# Patient Record
Sex: Male | Born: 1995 | Marital: Single | Smoking: Never smoker
Health system: Southern US, Community
[De-identification: ages and names within clinical notes are randomized; demographics above are authoritative.]

## PROBLEM LIST (undated history)

## (undated) DIAGNOSIS — F1290 Cannabis use, unspecified, uncomplicated: Secondary | ICD-10-CM

## (undated) HISTORY — DX: Cannabis use, unspecified, uncomplicated: F12.90

---
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
## 2017-09-15 ENCOUNTER — Other Ambulatory Visit: Payer: Self-pay

## 2017-09-15 ENCOUNTER — Emergency Department (HOSPITAL_COMMUNITY): Payer: 59

## 2017-09-15 ENCOUNTER — Encounter (HOSPITAL_COMMUNITY): Payer: Self-pay

## 2017-09-15 ENCOUNTER — Observation Stay (HOSPITAL_COMMUNITY)
Admission: EM | Admit: 2017-09-15 | Discharge: 2017-09-16 | Disposition: A | Discharge status: Home / Self Care | Payer: 59 | Attending: Emergency Medicine | Admitting: Emergency Medicine

## 2017-09-15 DIAGNOSIS — T2220XA Burn of second degree of shoulder and upper limb, except wrist and hand, unspecified site, initial encounter: Secondary | ICD-10-CM | POA: Diagnosis not present

## 2017-09-15 DIAGNOSIS — S41112A Laceration without foreign body of left upper arm, initial encounter: Secondary | ICD-10-CM

## 2017-09-15 DIAGNOSIS — S51012A Laceration without foreign body of left elbow, initial encounter: Secondary | ICD-10-CM | POA: Diagnosis not present

## 2017-09-15 DIAGNOSIS — T3120 Burns involving 20-29% of body surface with 0% to 9% third degree burns: Secondary | ICD-10-CM | POA: Diagnosis not present

## 2017-09-15 DIAGNOSIS — S0012XA Contusion of left eyelid and periocular area, initial encounter: Principal | ICD-10-CM | POA: Insufficient documentation

## 2017-09-15 DIAGNOSIS — T2020XA Burn of second degree of head, face, and neck, unspecified site, initial encounter: Secondary | ICD-10-CM | POA: Insufficient documentation

## 2017-09-15 DIAGNOSIS — R410 Disorientation, unspecified: Secondary | ICD-10-CM | POA: Diagnosis present

## 2017-09-15 DIAGNOSIS — F1290 Cannabis use, unspecified, uncomplicated: Secondary | ICD-10-CM | POA: Insufficient documentation

## 2017-09-15 DIAGNOSIS — T07XXXA Unspecified multiple injuries, initial encounter: Secondary | ICD-10-CM

## 2017-09-15 DIAGNOSIS — F1910 Other psychoactive substance abuse, uncomplicated: Secondary | ICD-10-CM | POA: Diagnosis not present

## 2017-09-15 DIAGNOSIS — T2121XA Burn of second degree of chest wall, initial encounter: Secondary | ICD-10-CM | POA: Insufficient documentation

## 2017-09-15 DIAGNOSIS — T300 Burn of unspecified body region, unspecified degree: Secondary | ICD-10-CM

## 2017-09-15 LAB — ETHANOL: Alcohol, Ethyl (B): <10 mg/dL (ref <10)

## 2017-09-15 LAB — CBC
HEMATOCRIT: 42.2 % (ref 39.0–52.0)
Hemoglobin: 14.5 g/dL (ref 13.0–17.0)
MCH: 33 pg (ref 26.0–34.0)
MCHC: 34.4 g/dL (ref 30.0–36.0)
MCV: 96.1 fL (ref 78.0–100.0)
Platelets: 352 10*3/uL (ref 150–400)
RBC: 4.39 MIL/uL (ref 4.22–5.81)
RDW: 13.2 % (ref 11.5–15.5)
WBC: 19.5 10*3/uL — ABNORMAL HIGH (ref 4.0–10.5)

## 2017-09-15 LAB — COMPREHENSIVE METABOLIC PANEL
ALBUMIN: 4.2 g/dL (ref 3.5–5.0)
ALK PHOS: 68 U/L (ref 38–126)
ALT: 96 U/L — ABNORMAL HIGH (ref 17–63)
AST: 145 U/L — AB (ref 15–41)
Anion gap: 10 (ref 5–15)
BUN: 11 mg/dL (ref 6–20)
CALCIUM: 8.8 mg/dL — AB (ref 8.9–10.3)
CO2: 21 mmol/L — ABNORMAL LOW (ref 22–32)
Chloride: 110 mmol/L (ref 101–111)
Creatinine, Ser: 1.01 mg/dL (ref 0.61–1.24)
GFR calc Af Amer: >60 mL/min (ref >60)
GFR calc non Af Amer: >60 mL/min (ref >60)
GLUCOSE: 168 mg/dL — AB (ref 65–99)
POTASSIUM: 3.1 mmol/L — AB (ref 3.5–5.1)
Sodium: 141 mmol/L (ref 135–145)
TOTAL PROTEIN: 6.9 g/dL (ref 6.5–8.1)
Total Bilirubin: 0.5 mg/dL (ref 0.3–1.2)

## 2017-09-15 LAB — I-STAT CHEM 8, ED
BUN: 12 mg/dL (ref 6–20)
CHLORIDE: 107 mmol/L (ref 101–111)
Calcium, Ion: 1.16 mmol/L (ref 1.15–1.40)
Creatinine, Ser: 0.9 mg/dL (ref 0.61–1.24)
Glucose, Bld: 177 mg/dL — ABNORMAL HIGH (ref 65–99)
HEMATOCRIT: 43 % (ref 39.0–52.0)
Hemoglobin: 14.6 g/dL (ref 13.0–17.0)
Potassium: 3.1 mmol/L — ABNORMAL LOW (ref 3.5–5.1)
SODIUM: 144 mmol/L (ref 135–145)
TCO2: 24 mmol/L (ref 22–32)

## 2017-09-15 LAB — I-STAT CG4 LACTIC ACID, ED: LACTIC ACID, VENOUS: 2.76 mmol/L — AB (ref 0.5–1.9)

## 2017-09-15 LAB — PROTIME-INR
INR: 1.12
Prothrombin Time: 14.4 seconds (ref 11.4–15.2)

## 2017-09-15 LAB — MRSA PCR SCREENING: MRSA BY PCR: POSITIVE — AB

## 2017-09-15 LAB — CDS SEROLOGY

## 2017-09-15 LAB — SAMPLE TO BLOOD BANK

## 2017-09-15 MED ORDER — SODIUM CHLORIDE 0.9 % IV BOLUS
1000.0000 mL | Freq: Once | INTRAVENOUS | Status: AC
  Administered 2017-09-15: 1000 mL via INTRAVENOUS

## 2017-09-15 MED ORDER — KETAMINE HCL 10 MG/ML IJ SOLN
80.0000 mg | Freq: Once | INTRAMUSCULAR | Status: AC
  Administered 2017-09-15: 80 mg via INTRAVENOUS

## 2017-09-15 MED ORDER — SODIUM CHLORIDE 0.9 % IV SOLN
INTRAVENOUS | Status: DC
  Administered 2017-09-15: 10:00:00 via INTRAVENOUS

## 2017-09-15 MED ORDER — ADULT MULTIVITAMIN W/MINERALS CH
1.0000 | ORAL_TABLET | Freq: Every day | ORAL | Status: DC
  Administered 2017-09-16: 1 via ORAL
  Filled 2017-09-15: qty 1

## 2017-09-15 MED ORDER — TETANUS-DIPHTH-ACELL PERTUSSIS 5-2.5-18.5 LF-MCG/0.5 IM SUSP
0.5000 mL | Freq: Once | INTRAMUSCULAR | Status: AC
  Administered 2017-09-15: 0.5 mL via INTRAMUSCULAR
  Filled 2017-09-15: qty 0.5

## 2017-09-15 MED ORDER — MIDAZOLAM HCL 2 MG/2ML IJ SOLN
1.0000 mg | Freq: Once | INTRAMUSCULAR | Status: AC
  Administered 2017-09-15: 1 mg via INTRAVENOUS

## 2017-09-15 MED ORDER — ETOMIDATE 2 MG/ML IV SOLN
10.0000 mg | Freq: Once | INTRAVENOUS | Status: AC
  Administered 2017-09-15: 10 mg via INTRAVENOUS

## 2017-09-15 MED ORDER — POTASSIUM CHLORIDE IN NACL 20-0.9 MEQ/L-% IV SOLN
INTRAVENOUS | Status: DC
  Administered 2017-09-15: 13:00:00 via INTRAVENOUS
  Filled 2017-09-15 (×2): qty 1000

## 2017-09-15 MED ORDER — MIDAZOLAM HCL 2 MG/2ML IJ SOLN
INTRAMUSCULAR | Status: AC
  Filled 2017-09-15: qty 2

## 2017-09-15 MED ORDER — KETAMINE HCL 10 MG/ML IJ SOLN
INTRAMUSCULAR | Status: AC
  Administered 2017-09-15: 65 mg
  Filled 2017-09-15: qty 1

## 2017-09-15 MED ORDER — KETAMINE HCL 10 MG/ML IJ SOLN
40.0000 mg | Freq: Once | INTRAMUSCULAR | Status: AC
  Administered 2017-09-15: 40 mg via INTRAVENOUS

## 2017-09-15 MED ORDER — MIDAZOLAM HCL 2 MG/2ML IJ SOLN
INTRAMUSCULAR | Status: AC
  Administered 2017-09-15: 1 mg via INTRAVENOUS
  Filled 2017-09-15: qty 2

## 2017-09-15 MED ORDER — MIDAZOLAM HCL 2 MG/2ML IJ SOLN
5.0000 mg | Freq: Once | INTRAMUSCULAR | Status: AC
  Administered 2017-09-15: 4 mg via INTRAVENOUS

## 2017-09-15 MED ORDER — FENTANYL BOLUS VIA INFUSION
50.0000 ug | INTRAVENOUS | Status: DC | PRN
  Filled 2017-09-15: qty 50

## 2017-09-15 MED ORDER — IBUPROFEN 200 MG PO TABS
600.0000 mg | ORAL_TABLET | Freq: Three times a day (TID) | ORAL | Status: DC
  Administered 2017-09-15 – 2017-09-16 (×4): 600 mg via ORAL
  Filled 2017-09-15 (×4): qty 3

## 2017-09-15 MED ORDER — ONDANSETRON HCL 4 MG/2ML IJ SOLN
4.0000 mg | Freq: Four times a day (QID) | INTRAMUSCULAR | Status: DC | PRN
  Administered 2017-09-15: 4 mg via INTRAVENOUS
  Filled 2017-09-15: qty 2

## 2017-09-15 MED ORDER — BACITRACIN-NEOMYCIN-POLYMYXIN OINTMENT TUBE
1.0000 "application " | TOPICAL_OINTMENT | Freq: Two times a day (BID) | CUTANEOUS | Status: DC
  Administered 2017-09-15 – 2017-09-16 (×2): 1 via TOPICAL
  Filled 2017-09-15: qty 14.17

## 2017-09-15 MED ORDER — DOCUSATE SODIUM 100 MG PO CAPS
100.0000 mg | ORAL_CAPSULE | Freq: Two times a day (BID) | ORAL | Status: DC
  Administered 2017-09-15 – 2017-09-16 (×2): 100 mg via ORAL
  Filled 2017-09-15 (×2): qty 1

## 2017-09-15 MED ORDER — FENTANYL 2500MCG IN NS 250ML (10MCG/ML) PREMIX INFUSION
25.0000 ug/h | INTRAVENOUS | Status: DC
Start: 2017-09-15 — End: 2017-09-15
  Filled 2017-09-15: qty 250

## 2017-09-15 MED ORDER — MUPIROCIN 2 % EX OINT
1.0000 "application " | TOPICAL_OINTMENT | Freq: Two times a day (BID) | CUTANEOUS | Status: DC
  Administered 2017-09-15 – 2017-09-16 (×2): 1 via NASAL
  Filled 2017-09-15: qty 22

## 2017-09-15 MED ORDER — IOPAMIDOL (ISOVUE-300) INJECTION 61%
100.0000 mL | Freq: Once | INTRAVENOUS | Status: AC | PRN
  Administered 2017-09-15: 100 mL via INTRAVENOUS

## 2017-09-15 MED ORDER — FAMOTIDINE IN NACL 20-0.9 MG/50ML-% IV SOLN
20.0000 mg | INTRAVENOUS | Status: DC
  Administered 2017-09-15: 20 mg via INTRAVENOUS
  Filled 2017-09-15: qty 50

## 2017-09-15 MED ORDER — HYDRALAZINE HCL 20 MG/ML IJ SOLN: 10.0000 mg | INTRAMUSCULAR | Status: DC | PRN

## 2017-09-15 MED ORDER — ACETAMINOPHEN 500 MG PO TABS
1000.0000 mg | ORAL_TABLET | Freq: Three times a day (TID) | ORAL | Status: DC
  Administered 2017-09-15 – 2017-09-16 (×4): 1000 mg via ORAL
  Filled 2017-09-15 (×4): qty 2

## 2017-09-15 MED ORDER — SILVER SULFADIAZINE 1 % EX CREA
TOPICAL_CREAM | Freq: Every day | CUTANEOUS | Status: DC
  Administered 2017-09-15: 20:00:00 via TOPICAL
  Administered 2017-09-15: 1 via TOPICAL
  Administered 2017-09-16: 10:00:00 via TOPICAL
  Filled 2017-09-15 (×2): qty 85

## 2017-09-15 MED ORDER — ENOXAPARIN SODIUM 40 MG/0.4ML ~~LOC~~ SOLN
40.0000 mg | SUBCUTANEOUS | Status: DC
  Administered 2017-09-16: 40 mg via SUBCUTANEOUS
  Filled 2017-09-15: qty 0.4

## 2017-09-15 MED ORDER — LORAZEPAM 2 MG/ML IJ SOLN: 1.0000 mg | INTRAMUSCULAR | Status: DC | PRN

## 2017-09-15 MED ORDER — ONDANSETRON 4 MG PO TBDP: 4.0000 mg | ORAL_TABLET | Freq: Four times a day (QID) | ORAL | Status: DC | PRN

## 2017-09-15 MED ORDER — PANTOPRAZOLE SODIUM 40 MG PO TBEC
40.0000 mg | DELAYED_RELEASE_TABLET | ORAL | Status: DC
  Administered 2017-09-16: 40 mg via ORAL
  Filled 2017-09-15 (×2): qty 1

## 2017-09-15 MED ORDER — HALOPERIDOL LACTATE 5 MG/ML IJ SOLN
1.0000 mg | Freq: Four times a day (QID) | INTRAMUSCULAR | Status: DC | PRN
  Filled 2017-09-15: qty 1

## 2017-09-15 MED ORDER — MIDAZOLAM HCL 2 MG/2ML IJ SOLN
INTRAMUSCULAR | Status: AC
  Administered 2017-09-15: 2 mg
  Filled 2017-09-15: qty 2

## 2017-09-15 MED ORDER — MORPHINE SULFATE (PF) 4 MG/ML IV SOLN: 1.0000 mg | INTRAVENOUS | Status: DC | PRN

## 2017-09-15 MED ORDER — MIDAZOLAM HCL 2 MG/2ML IJ SOLN: 2.0000 mg | INTRAMUSCULAR | Status: DC | PRN

## 2017-09-15 MED ORDER — ETOMIDATE 2 MG/ML IV SOLN
8.0000 mg | Freq: Once | INTRAVENOUS | Status: AC
  Administered 2017-09-15: 8 mg via INTRAVENOUS

## 2017-09-15 MED ORDER — FENTANYL CITRATE (PF) 100 MCG/2ML IJ SOLN
50.0000 ug | Freq: Once | INTRAMUSCULAR | Status: AC
  Administered 2017-09-15: 50 ug via INTRAVENOUS
  Filled 2017-09-15: qty 2

## 2017-09-15 MED ORDER — OXYCODONE HCL 5 MG PO TABS
5.0000 mg | ORAL_TABLET | ORAL | Status: DC | PRN
  Administered 2017-09-15 – 2017-09-16 (×5): 10 mg via ORAL
  Filled 2017-09-15 (×5): qty 2

## 2017-09-15 MED ORDER — CHLORHEXIDINE GLUCONATE CLOTH 2 % EX PADS
6.0000 | MEDICATED_PAD | Freq: Every day | CUTANEOUS | Status: DC
  Administered 2017-09-16: 6 via TOPICAL

## 2017-09-15 MED ORDER — METHOCARBAMOL 1000 MG/10ML IJ SOLN
500.0000 mg | Freq: Three times a day (TID) | INTRAVENOUS | Status: DC
  Administered 2017-09-15 – 2017-09-16 (×3): 500 mg via INTRAVENOUS
  Filled 2017-09-15 (×3): qty 5
  Filled 2017-09-15: qty 550

## 2017-09-15 NOTE — ED Notes (Signed)
  etomidate given per Dr Harland Dingwall verbal order

## 2017-09-15 NOTE — ED Notes (Signed)
Pt transported up to 4N by this RN and Education officer, museum. Pt had no belongings at the bedside upon time of transport. There was a shoe in the room at one point during visit, after pt's mother and father left, shoe was gone.

## 2017-09-15 NOTE — ED Notes (Signed)
Suture cart at bedside for Dr Eudelia Bunch
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
## 2017-09-15 NOTE — ED Notes (Addendum)
This RN and Chief Technology Officer with RT taking pt to CT. Pt very combative and noncompliant with scans. Dr Eudelia Bunch notified.  versed ordered by Dr Eudelia Bunch and given by this RN

## 2017-09-15 NOTE — ED Notes (Signed)
  etomidate given by this RN. Dr Eudelia Bunch in CT with pt, Suction set up and all airway supplies at bedside.

## 2017-09-15 NOTE — ED Notes (Signed)
Pt still very combative

## 2017-09-15 NOTE — ED Notes (Signed)
Pt alert, pt explained plan of care, pt restless but not combative at this time. Still waiting for dr Patrick Moses to look at pt's left elbow.
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
## 2017-09-15 NOTE — ED Notes (Signed)
Pt resting, airway intact, soft restraints on wrists bilaterally, VSS.
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
## 2017-09-15 NOTE — ED Notes (Signed)
Portable xray in room, pt becoming agitated again, Dr Eudelia Bunch ordered  ketamine to be given.

## 2017-09-15 NOTE — ED Notes (Signed)
Dr.Cardama made aware of pt Lactic Acid results. ED-Lab

## 2017-09-15 NOTE — ED Notes (Signed)
80mg ketamine given
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
## 2017-09-15 NOTE — Plan of Care (Signed)
Patient resting comfortably after arrival to ICU, voided in urinal, calm and cooperative. Will continue to monitor.

## 2017-09-15 NOTE — ED Notes (Signed)
Pt sleeping in bed, calm, VSS.

## 2017-09-15 NOTE — ED Notes (Signed)
65 mg ketamine given
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
## 2017-09-16 ENCOUNTER — Other Ambulatory Visit: Payer: Self-pay

## 2017-09-16 ENCOUNTER — Encounter (HOSPITAL_COMMUNITY): Payer: Self-pay | Admitting: *Deleted

## 2017-09-16 LAB — COMPREHENSIVE METABOLIC PANEL
ALK PHOS: 64 U/L (ref 38–126)
ALT: 73 U/L — ABNORMAL HIGH (ref 17–63)
AST: 104 U/L — ABNORMAL HIGH (ref 15–41)
Albumin: 3.7 g/dL (ref 3.5–5.0)
Anion gap: 10 (ref 5–15)
BILIRUBIN TOTAL: 1.5 mg/dL — AB (ref 0.3–1.2)
BUN: 6 mg/dL (ref 6–20)
CO2: 22 mmol/L (ref 22–32)
Calcium: 8.9 mg/dL (ref 8.9–10.3)
Chloride: 110 mmol/L (ref 101–111)
Creatinine, Ser: 0.92 mg/dL (ref 0.61–1.24)
GFR calc Af Amer: >60 mL/min (ref >60)
GFR calc non Af Amer: >60 mL/min (ref >60)
GLUCOSE: 93 mg/dL (ref 65–99)
Potassium: 3.8 mmol/L (ref 3.5–5.1)
SODIUM: 142 mmol/L (ref 135–145)
TOTAL PROTEIN: 6.1 g/dL — AB (ref 6.5–8.1)

## 2017-09-16 LAB — CBC
HCT: 40.9 % (ref 39.0–52.0)
Hemoglobin: 14 g/dL (ref 13.0–17.0)
MCH: 32.6 pg (ref 26.0–34.0)
MCHC: 34.2 g/dL (ref 30.0–36.0)
MCV: 95.3 fL (ref 78.0–100.0)
Platelets: 255 10*3/uL (ref 150–400)
RBC: 4.29 MIL/uL (ref 4.22–5.81)
RDW: 13.3 % (ref 11.5–15.5)
WBC: 10.5 10*3/uL (ref 4.0–10.5)

## 2017-09-16 LAB — HIV ANTIBODY (ROUTINE TESTING W REFLEX): HIV Screen 4th Generation wRfx: NONREACTIVE

## 2017-09-16 MED ORDER — OXYCODONE HCL 5 MG PO TABS: 5.0000 mg | ORAL_TABLET | ORAL | 0 refills | Status: AC | PRN

## 2017-09-16 MED ORDER — ADULT MULTIVITAMIN W/MINERALS CH: 1.0000 | ORAL_TABLET | Freq: Every day | ORAL | Status: AC

## 2017-09-16 MED ORDER — ACETAMINOPHEN 500 MG PO TABS: 1000.0000 mg | ORAL_TABLET | Freq: Three times a day (TID) | ORAL | 0 refills | Status: AC

## 2017-09-16 MED ORDER — SILVER SULFADIAZINE 1 % EX CREA: TOPICAL_CREAM | Freq: Every day | CUTANEOUS | 0 refills | Status: AC

## 2017-09-16 MED ORDER — MUPIROCIN 2 % EX OINT: 1.0000 "application " | TOPICAL_OINTMENT | Freq: Two times a day (BID) | CUTANEOUS | 0 refills | Status: AC

## 2017-09-16 MED ORDER — IBUPROFEN 600 MG PO TABS: 600.0000 mg | ORAL_TABLET | Freq: Three times a day (TID) | ORAL | 0 refills | Status: AC

## 2017-09-16 NOTE — Discharge Instructions (Signed)
Follow up at wound care clinic. Call to confirm appointment date/time.  [phone] [address] [place], [place] [address]
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
## 2017-09-16 NOTE — Progress Notes (Signed)
OT Screen Note  Patient Details Name: [name] MRN: [id number] DOB: Nov 16, 1995   Cancelled Treatment:    Reason Eval/Treat Not Completed: OT screened, no needs identified, will sign off  Harolyn Rutherford  [phone]  *PT Rocky Link notified OT of no acute needs at this time.  09/16/2017, 11:05 AM
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
## 2017-09-17 ENCOUNTER — Encounter (HOSPITAL_COMMUNITY): Payer: Self-pay

## 2017-09-24 ENCOUNTER — Ambulatory Visit: Payer: 59 | Admitting: Adult Health

## 2017-09-30 ENCOUNTER — Encounter (HOSPITAL_BASED_OUTPATIENT_CLINIC_OR_DEPARTMENT_OTHER): Payer: 59 | Attending: Internal Medicine

## 2017-09-30 DIAGNOSIS — T22242A Burn of second degree of left axilla, initial encounter: Secondary | ICD-10-CM | POA: Insufficient documentation

## 2017-09-30 DIAGNOSIS — T22252A Burn of second degree of left shoulder, initial encounter: Secondary | ICD-10-CM | POA: Diagnosis present

## 2017-09-30 DIAGNOSIS — T2121XA Burn of second degree of chest wall, initial encounter: Secondary | ICD-10-CM | POA: Diagnosis not present

## 2017-09-30 DIAGNOSIS — X12XXXA Contact with other hot fluids, initial encounter: Secondary | ICD-10-CM | POA: Insufficient documentation

## 2017-09-30 DIAGNOSIS — F17210 Nicotine dependence, cigarettes, uncomplicated: Secondary | ICD-10-CM | POA: Insufficient documentation
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
